# Patient Record
Sex: Female | Born: 1990 | Race: White | Hispanic: No | Marital: Single | Smoking: Current every day smoker
Health system: Southern US, Community
[De-identification: ages and names within clinical notes are randomized; demographics above are authoritative.]

## PROBLEM LIST (undated history)

## (undated) ENCOUNTER — Inpatient Hospital Stay (HOSPITAL_COMMUNITY): Payer: Self-pay

## (undated) DIAGNOSIS — R112 Nausea with vomiting, unspecified: Secondary | ICD-10-CM

## (undated) DIAGNOSIS — Z9889 Other specified postprocedural states: Secondary | ICD-10-CM

## (undated) DIAGNOSIS — E063 Autoimmune thyroiditis: Secondary | ICD-10-CM

## (undated) DIAGNOSIS — A4902 Methicillin resistant Staphylococcus aureus infection, unspecified site: Secondary | ICD-10-CM

## (undated) HISTORY — DX: Methicillin resistant Staphylococcus aureus infection, unspecified site: A49.02

## (undated) HISTORY — DX: Other specified postprocedural states: R11.2

## (undated) HISTORY — DX: Autoimmune thyroiditis: E06.3

## (undated) HISTORY — PX: LEG WOUND REPAIR / CLOSURE: SUR1143

## (undated) HISTORY — DX: Other specified postprocedural states: Z98.890

## (undated) HISTORY — PX: WISDOM TOOTH EXTRACTION: SHX21

---
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
## 2015-04-14 ENCOUNTER — Encounter: Payer: Self-pay | Admitting: *Deleted

## 2015-04-20 ENCOUNTER — Inpatient Hospital Stay (HOSPITAL_COMMUNITY): Payer: BLUE CROSS/BLUE SHIELD

## 2015-04-20 ENCOUNTER — Ambulatory Visit (INDEPENDENT_AMBULATORY_CARE_PROVIDER_SITE_OTHER): Payer: Medicaid Other | Admitting: Student

## 2015-04-20 ENCOUNTER — Inpatient Hospital Stay (HOSPITAL_COMMUNITY)
Admission: AD | Admit: 2015-04-20 | Discharge: 2015-04-20 | Disposition: A | Discharge status: Home / Self Care | Payer: BLUE CROSS/BLUE SHIELD | Source: Ambulatory Visit | Attending: Family Medicine | Admitting: Family Medicine

## 2015-04-20 ENCOUNTER — Encounter: Payer: Self-pay | Admitting: Student

## 2015-04-20 ENCOUNTER — Encounter (HOSPITAL_COMMUNITY): Payer: Self-pay | Admitting: *Deleted

## 2015-04-20 ENCOUNTER — Encounter (HOSPITAL_COMMUNITY): Payer: Self-pay | Admitting: Anesthesiology

## 2015-04-20 VITALS — BP 107/66 | HR 75 | Temp 98.4°F | Ht 61.0 in | Wt 91.2 lb

## 2015-04-20 DIAGNOSIS — Z3481 Encounter for supervision of other normal pregnancy, first trimester: Secondary | ICD-10-CM | POA: Diagnosis not present

## 2015-04-20 DIAGNOSIS — Z3491 Encounter for supervision of normal pregnancy, unspecified, first trimester: Secondary | ICD-10-CM

## 2015-04-20 DIAGNOSIS — IMO0002 Reserved for concepts with insufficient information to code with codable children: Secondary | ICD-10-CM

## 2015-04-20 DIAGNOSIS — Z3A13 13 weeks gestation of pregnancy: Secondary | ICD-10-CM | POA: Diagnosis not present

## 2015-04-20 DIAGNOSIS — O021 Missed abortion: Secondary | ICD-10-CM

## 2015-04-20 DIAGNOSIS — O358XX0 Maternal care for other (suspected) fetal abnormality and damage, not applicable or unspecified: Secondary | ICD-10-CM

## 2015-04-20 LAB — ABO/RH: ABO/RH(D): A POS

## 2015-04-20 LAB — POCT URINALYSIS DIP (DEVICE)
BILIRUBIN URINE: NEGATIVE
GLUCOSE, UA: NEGATIVE mg/dL
KETONES UR: NEGATIVE mg/dL
Leukocytes, UA: NEGATIVE
Nitrite: NEGATIVE
PROTEIN: NEGATIVE mg/dL
SPECIFIC GRAVITY, URINE: 1.025 (ref 1.005–1.030)
Urobilinogen, UA: 0.2 mg/dL (ref 0.0–1.0)
pH: 7 (ref 5.0–8.0)

## 2015-04-20 LAB — TYPE AND SCREEN
ABO/RH(D): A POS
Antibody Screen: NEGATIVE

## 2015-04-20 LAB — CBC
HEMATOCRIT: 38.5 % (ref 36.0–46.0)
HEMOGLOBIN: 13.4 g/dL (ref 12.0–15.0)
MCH: 31.8 pg (ref 26.0–34.0)
MCHC: 34.8 g/dL (ref 30.0–36.0)
MCV: 91.4 fL (ref 78.0–100.0)
PLATELETS: 265 10*3/uL (ref 150–400)
RBC: 4.21 MIL/uL (ref 3.87–5.11)
RDW: 12.8 % (ref 11.5–15.5)
WBC: 9.2 10*3/uL (ref 4.0–10.5)

## 2015-04-20 MED ORDER — BUTALBITAL-APAP-CAFFEINE 50-325-40 MG PO TABS: 1.0000 | ORAL_TABLET | Freq: Four times a day (QID) | ORAL | Status: DC | PRN

## 2015-04-20 MED ORDER — DOXYCYCLINE HYCLATE 100 MG IV SOLR
100.0000 mg | INTRAVENOUS | Status: AC
  Administered 2015-04-21: 100 mg via INTRAVENOUS
  Filled 2015-04-20: qty 100

## 2015-04-20 MED ORDER — BUTALBITAL-APAP-CAFFEINE 50-325-40 MG PO TABS
2.0000 | ORAL_TABLET | Freq: Once | ORAL | Status: AC
  Administered 2015-04-20: 2 via ORAL
  Filled 2015-04-20: qty 2

## 2015-04-20 NOTE — MAU Provider Note (Signed)
Chief Complaint: IUFD    First Provider Initiated Contact with Patient 04/20/15 1440     SUBJECTIVE HPI: Katie GeraldsHaley N Melton is a 24 y.o. G3P1011 at 2238w4d who sent to [facility] Admissions from clinic for absence of FHTs by doppler and informal BS US. [facility] [place]. Pt denies VB or abd pain. Needs formal US to confirm demise, measures CRL and determine if pt is candidate for cytotec or will need D&C.   Past Medical History  Diagnosis Date  . PONV (postoperative nausea and vomiting)   . Hashimoto's disease   . MRSA (methicillin resistant Staphylococcus aureus)    OB History  Gravida Para Term Preterm AB SAB TAB Ectopic Multiple Living  3 1 1  0 1 1 0 0 0 1    # Outcome Date GA Lbr Len/2nd Weight Sex Delivery Anes PTL Lv  3 Current           2 SAB 01/2015          1 Term 12/21/11 4338w0d  6 lb 13 oz (3.09 kg) F Vag-Spont  Y Y     Complications: Tachycardia     Past Surgical History  Procedure Laterality Date  . Leg wound repair / closure      has MRSA on thigh, had to be cut out  . Wisdom tooth extraction     Social History   Social History  . Marital Status: Single    Spouse Name: N/A  . Number of Children: N/A  . Years of Education: N/A   Occupational History  . Not on file.   Social History Main Topics  . Smoking status: Former Games developermoker  . Smokeless tobacco: Never Used  . Alcohol Use: No  . Drug Use: No  . Sexual Activity: Yes   Other Topics Concern  . Not on file   Social History Narrative   No current facility-administered medications on file prior to encounter.   No current outpatient prescriptions on file prior to encounter.   Allergies  Allergen Reactions  . Flagyl [Metronidazole] Hives  . Latex Itching  . Sulfa Antibiotics Hives    I have reviewed the past Medical Hx, Surgical Hx, Social Hx, Allergies and Medications.   Review of Systems  Constitutional: Negative for fever and chills.   Gastrointestinal: Negative for abdominal pain.  Genitourinary: Negative for vaginal bleeding.    OBJECTIVE Patient Vitals for the past 24 hrs:  BP Temp Temp src Pulse Resp  04/20/15 1423 115/81 mmHg 98.2 F (36.8 C) Oral 88 18   Constitutional: Well-developed, well-nourished female in no acute distress. Tearful. Cardiovascular: normal rate Respiratory: normal rate and effort.  GI: Abd soft, non-tender, gravid, S<D Neurologic: Alert and oriented x 4.  GU: Deferred  LAB RESULTS Results for orders placed or performed during the hospital encounter of 04/20/15 (from the past 24 hour(s))  CBC     Status: None   Collection Time: 04/20/15  2:58 PM  Result Value Ref Range   WBC 9.2 4.0 - 10.5 K/uL   RBC 4.21 3.87 - 5.11 MIL/uL   Hemoglobin 13.4 12.0 - 15.0 g/dL   HCT 21.338.5 08.636.0 - 57.846.0 %   MCV 91.4 78.0 - 100.0 fL   MCH 31.8 26.0 - 34.0 pg   MCHC 34.8 30.0 - 36.0 g/dL   RDW 46.912.8 62.911.5 - 52.815.5 %   Platelets 265 150 - 400 K/uL  Type and screen  Status: None   Collection Time: 04/20/15  2:58 PM  Result Value Ref Range   ABO/RH(D) A POS    Antibody Screen NEG    Sample Expiration 04/23/2015   ABO/Rh     Status: None   Collection Time: 04/20/15  2:58 PM  Result Value Ref Range   ABO/RH(D) A POS     IMAGING US Ob Comp Less 14 Wks  04/20/2015  CLINICAL DATA:  No fetal heart tones EXAM: OBSTETRIC <14 WK ULTRASOUND TECHNIQUE: Transabdominal ultrasound was performed for evaluation of the gestation as well as the maternal uterus and adnexal regions. COMPARISON:  None. FINDINGS: Intrauterine gestational sac: Visualized/normal in shape. Yolk sac:  Not visualized Embryo:  Visualized Cardiac Activity: Not visualized Heart Rate:  bpm MSD:   mm    w     d CRL:   40.1  mm   10 w 6 d                  [place] EDC: 11/10/2015 Maternal uterus/adnexae: No subchorionic hemorrhage. No adnexal masses or free fluid. IMPRESSION: No detectable fetal heart tones on today's study. Estimated gestational age [redacted] weeks  6 days. Findings meet definitive criteria for failed pregnancy. This follows SRU consensus guidelines: Diagnostic Criteria for Nonviable Pregnancy Early in the First Trimester. Macy Mis J Med [phone]. Electronically Signed   By: Charlett Nose M.D.   On: 04/20/2015 15:52    MAU COURSE [place], CBC, T&S, NPO.   [place] confirms demise. W/ CRL 10.6 pt is not candidate for Cytotec. Discussed Hx, exam, [place] w/ Dr. Shawnie Pons who came to see pt. D&C scheduled for 1300 tomorrow. Light bleeding started after [place].   Patient reports headache consistent with chronic headaches. Requesting pain medication. No Fever, chills, vision changes, difficulties with speech or gait. Fioricet given.  Headache improved significantly.  MDM Fetal demise with fetus measuring 10.6 weeks. Not candidate for Cytotec. Scheduled for [facility] tomorrow. Patient hemodynamically stable.  ASSESSMENT 1. Missed abortion   2. Absence of fetal heart activity     PLAN Discharge home in stable condition. SAB Precautions Support given. Offered chaplain, declined. Nothing by mouth after midnight.     Follow-up Information    [facility] with [facility] On 04/21/2015.   Why:  arrive at 12:00 noon for D&C at 1:00 pm   Contact information:   [address] [id number] mc [place] [address] [phone]      Follow up with [facility].   Why:  As needed in emergencies (soaking a pad an hour for more than 1 hour, fever, severe abd pain)   Contact information:   [address] [id number] mc [place] [address] [phone]       Medication List    TAKE these medications        butalbital-acetaminophen-caffeine 50-325-40 MG tablet  Commonly known as:  FIORICET, ESGIC  Take 1-2 tablets by mouth every 6 (six) hours as needed for headache.        [place], CNM 04/20/2015  4:58 PM

## 2015-04-20 NOTE — Progress Notes (Signed)
Patient presented for initial OB visit. Denies abdominal pain or vaginal bleeding.  EDD based on 7516w6d ultrasound in [place] (see media tab).  Unable to doppler fetal heart tones today.  Per bedside ultrasound by Wynelle BourgeoisMarie Williams CNM, no cardiac activity.  S/w Dr. Shawnie PonsPratt about patient; will send to MAU for official ultrasound & treatment options.   Judeth HornErin Derrious Bologna, NP

## 2015-04-20 NOTE — MAU Note (Signed)
Sent from clinic for U/S and tx options due to IUFD.

## 2015-04-20 NOTE — Progress Notes (Signed)
Reports chest pain the other night; also reports lower back pain & lower stomach cramps

## 2015-04-20 NOTE — Progress Notes (Signed)
Work notes given to pt and sig other per request of V. Katrinka BlazingSmith, CNM.

## 2015-04-20 NOTE — Progress Notes (Signed)
Dr. Shawnie PonsPratt discussed results and plan of care. [facility]

## 2015-04-20 NOTE — Discharge Instructions (Signed)
Do not eat or drink anything after midnight tonight. Arrive at admissions desk at noon tomorrow 04/20/15)  Incomplete Miscarriage A miscarriage is the sudden loss of an unborn baby (fetus) before the 20th week of pregnancy. In an incomplete miscarriage, parts of the fetus or placenta (afterbirth) remain in the body.  Having a miscarriage can be an emotional experience. Talk with your health care provider about any questions you may have about miscarrying, the grieving process, and your future pregnancy plans. CAUSES   Problems with the fetal chromosomes that make it impossible for the baby to develop normally. Problems with the baby's genes or chromosomes are most often the result of errors that occur by chance as the embryo divides and grows. The problems are not inherited from the parents.  Infection of the cervix or uterus.  Hormone problems.  Problems with the cervix, such as having an incompetent cervix. This is when the tissue in the cervix is not strong enough to hold the pregnancy.  Problems with the uterus, such as an abnormally shaped uterus, uterine fibroids, or congenital abnormalities.  Certain medical conditions.  Smoking, drinking alcohol, or taking illegal drugs.  Trauma. SYMPTOMS   Vaginal bleeding or spotting, with or without cramps or pain.  Pain or cramping in the abdomen or lower back.  Passing fluid, tissue, or blood clots from the vagina. DIAGNOSIS  Your health care provider will perform a physical exam. You may also have an ultrasound to confirm the miscarriage. Blood or urine tests may also be ordered. TREATMENT   Usually, a dilation and curettage (D&C) procedure is performed. During a D&C procedure, the cervix is widened (dilated) and any remaining fetal or placental tissue is gently removed from the uterus.  Antibiotic medicines are prescribed if there is an infection. Other medicines may be given to reduce the size of the uterus (contract) if there is a  lot of bleeding.  If you have Rh negative blood and your baby was Rh positive, you will need a Rho (D) immune globulin shot. This shot will protect any future baby from having Rh blood problems in future pregnancies.  You may be confined to bed rest. This means you should stay in bed and only get up to use the bathroom. HOME CARE INSTRUCTIONS   Rest as directed by your health care provider.  Restrict activity as directed by your health care provider. You may be allowed to continue light activity if curettage was not done but you require further treatment.  Keep track of the number of pads you use each day. Keep track of how soaked (saturated) they are. Record this information.  Do not  use tampons.  Do not douche or have sexual intercourse until approved by your health care provider.  Keep all follow-up appointments for reevaluation and continuing management.  Only take over-the-counter or prescription medicines for pain, fever, or discomfort as directed by your health care provider.  Take antibiotic medicine as directed by your health care provider. Make sure you finish it even if you start to feel better. SEEK IMMEDIATE MEDICAL CARE IF:   You experience severe cramps in your stomach, back, or abdomen.  You have an unexplained temperature (make sure to record these temperatures).  You pass large clots or tissue (save these for your health care provider to inspect).  Your bleeding increases.  You become light-headed, weak, or have fainting episodes. MAKE SURE YOU:   Understand these instructions.  Will watch your condition.  Will get help right  away if you are not doing well or get worse.   This information is not intended to replace advice given to you by your health care provider. Make sure you discuss any questions you have with your health care provider.   Document Released: 05/01/2005 Document Revised: 05/22/2014 Document Reviewed: 11/28/2012 Elsevier Interactive  Patient Education [employer]2016 Elsevier Inc.

## 2015-04-21 ENCOUNTER — Ambulatory Visit (HOSPITAL_COMMUNITY): Payer: BLUE CROSS/BLUE SHIELD | Admitting: Anesthesiology

## 2015-04-21 ENCOUNTER — Ambulatory Visit (HOSPITAL_COMMUNITY)
Admission: RE | Admit: 2015-04-21 | Discharge: 2015-04-21 | Disposition: A | Discharge status: Home / Self Care | Payer: BLUE CROSS/BLUE SHIELD | Source: Ambulatory Visit | Attending: Obstetrics and Gynecology | Admitting: Obstetrics and Gynecology

## 2015-04-21 ENCOUNTER — Encounter (HOSPITAL_COMMUNITY)
Admission: RE | Disposition: A | Discharge status: Home / Self Care | Payer: Self-pay | Source: Ambulatory Visit | Attending: Obstetrics and Gynecology

## 2015-04-21 DIAGNOSIS — O021 Missed abortion: Secondary | ICD-10-CM

## 2015-04-21 DIAGNOSIS — Z87891 Personal history of nicotine dependence: Secondary | ICD-10-CM | POA: Diagnosis not present

## 2015-04-21 DIAGNOSIS — Z3A10 10 weeks gestation of pregnancy: Secondary | ICD-10-CM | POA: Insufficient documentation

## 2015-04-21 HISTORY — PX: DILATION AND EVACUATION: SHX1459

## 2015-04-21 SURGERY — DILATION AND EVACUATION, UTERUS
Anesthesia: Monitor Anesthesia Care | Site: Vagina

## 2015-04-21 MED ORDER — KETOROLAC TROMETHAMINE 30 MG/ML IJ SOLN
INTRAMUSCULAR | Status: AC
  Filled 2015-04-21: qty 1

## 2015-04-21 MED ORDER — LACTATED RINGERS IV SOLN: INTRAVENOUS | Status: DC

## 2015-04-21 MED ORDER — ONDANSETRON HCL 4 MG/2ML IJ SOLN
INTRAMUSCULAR | Status: AC
Start: 2015-04-21 — End: 2015-04-21
  Filled 2015-04-21: qty 2

## 2015-04-21 MED ORDER — MEPERIDINE HCL 25 MG/ML IJ SOLN: 6.2500 mg | INTRAMUSCULAR | Status: DC | PRN

## 2015-04-21 MED ORDER — MIDAZOLAM HCL 2 MG/2ML IJ SOLN
INTRAMUSCULAR | Status: DC | PRN
  Administered 2015-04-21: 2 mg via INTRAVENOUS

## 2015-04-21 MED ORDER — OXYCODONE-ACETAMINOPHEN 5-325 MG PO TABS: 1.0000 | ORAL_TABLET | ORAL | Status: DC | PRN

## 2015-04-21 MED ORDER — LACTATED RINGERS IV SOLN
INTRAVENOUS | Status: DC
  Administered 2015-04-21 (×4): via INTRAVENOUS

## 2015-04-21 MED ORDER — OXYCODONE-ACETAMINOPHEN 5-325 MG PO TABS
ORAL_TABLET | ORAL | Status: AC
  Filled 2015-04-21: qty 1

## 2015-04-21 MED ORDER — SCOPOLAMINE 1 MG/3DAYS TD PT72
MEDICATED_PATCH | TRANSDERMAL | Status: AC
  Administered 2015-04-21: 1.5 mg via TRANSDERMAL
  Filled 2015-04-21: qty 1

## 2015-04-21 MED ORDER — SCOPOLAMINE 1 MG/3DAYS TD PT72
1.0000 | MEDICATED_PATCH | Freq: Once | TRANSDERMAL | Status: DC
  Administered 2015-04-21: 1.5 mg via TRANSDERMAL

## 2015-04-21 MED ORDER — OXYCODONE-ACETAMINOPHEN 5-325 MG PO TABS
1.0000 | ORAL_TABLET | ORAL | Status: DC | PRN
  Administered 2015-04-21: 1 via ORAL

## 2015-04-21 MED ORDER — DEXAMETHASONE SODIUM PHOSPHATE 10 MG/ML IJ SOLN
INTRAMUSCULAR | Status: AC
Start: 2015-04-21 — End: 2015-04-21
  Filled 2015-04-21: qty 1

## 2015-04-21 MED ORDER — KETOROLAC TROMETHAMINE 30 MG/ML IJ SOLN
INTRAMUSCULAR | Status: DC | PRN
  Administered 2015-04-21: 30 mg via INTRAVENOUS

## 2015-04-21 MED ORDER — PROPOFOL 10 MG/ML IV BOLUS
INTRAVENOUS | Status: DC | PRN
  Administered 2015-04-21 (×2): 20 mg via INTRAVENOUS
  Administered 2015-04-21: 30 mg via INTRAVENOUS
  Administered 2015-04-21: 20 mg via INTRAVENOUS

## 2015-04-21 MED ORDER — FENTANYL CITRATE (PF) 100 MCG/2ML IJ SOLN: 25.0000 ug | INTRAMUSCULAR | Status: DC | PRN

## 2015-04-21 MED ORDER — PROPOFOL 10 MG/ML IV BOLUS
INTRAVENOUS | Status: AC
Start: 2015-04-21 — End: 2015-04-21
  Filled 2015-04-21: qty 40

## 2015-04-21 MED ORDER — ONDANSETRON HCL 4 MG/2ML IJ SOLN
INTRAMUSCULAR | Status: DC | PRN
  Administered 2015-04-21: 4 mg via INTRAVENOUS

## 2015-04-21 MED ORDER — DEXAMETHASONE SODIUM PHOSPHATE 4 MG/ML IJ SOLN
INTRAMUSCULAR | Status: DC | PRN
  Administered 2015-04-21: 4 mg via INTRAVENOUS

## 2015-04-21 MED ORDER — LIDOCAINE HCL (CARDIAC) 20 MG/ML IV SOLN
INTRAVENOUS | Status: DC | PRN
  Administered 2015-04-21: 80 mg via INTRAVENOUS

## 2015-04-21 MED ORDER — PROMETHAZINE HCL 25 MG/ML IJ SOLN: 6.2500 mg | INTRAMUSCULAR | Status: DC | PRN

## 2015-04-21 MED ORDER — CHLOROPROCAINE HCL 1 % IJ SOLN
INTRAMUSCULAR | Status: AC
  Filled 2015-04-21: qty 30

## 2015-04-21 MED ORDER — IBUPROFEN 600 MG PO TABS: 600.0000 mg | ORAL_TABLET | Freq: Four times a day (QID) | ORAL | Status: DC | PRN

## 2015-04-21 MED ORDER — MIDAZOLAM HCL 2 MG/2ML IJ SOLN
INTRAMUSCULAR | Status: AC
  Filled 2015-04-21: qty 2

## 2015-04-21 MED ORDER — LIDOCAINE HCL (CARDIAC) 20 MG/ML IV SOLN
INTRAVENOUS | Status: AC
  Filled 2015-04-21: qty 5

## 2015-04-21 MED ORDER — FENTANYL CITRATE (PF) 100 MCG/2ML IJ SOLN
INTRAMUSCULAR | Status: AC
  Filled 2015-04-21: qty 2

## 2015-04-21 MED ORDER — FENTANYL CITRATE (PF) 100 MCG/2ML IJ SOLN
INTRAMUSCULAR | Status: AC
  Filled 2015-04-21: qty 4

## 2015-04-21 MED ORDER — CHLOROPROCAINE HCL 1 % IJ SOLN
INTRAMUSCULAR | Status: DC | PRN
  Administered 2015-04-21: 10 mL

## 2015-04-21 MED ORDER — METOCLOPRAMIDE HCL 5 MG/ML IJ SOLN
INTRAMUSCULAR | Status: DC | PRN
  Administered 2015-04-21: 10 mg via INTRAVENOUS

## 2015-04-21 MED ORDER — FENTANYL CITRATE (PF) 100 MCG/2ML IJ SOLN
INTRAMUSCULAR | Status: DC | PRN
  Administered 2015-04-21: 25 ug via INTRAVENOUS
  Administered 2015-04-21 (×2): 50 ug via INTRAVENOUS

## 2015-04-21 MED ORDER — DIPHENHYDRAMINE HCL 50 MG/ML IJ SOLN
INTRAMUSCULAR | Status: DC | PRN
  Administered 2015-04-21: 12.5 mg via INTRAVENOUS

## 2015-04-21 SURGICAL SUPPLY — 20 items
CATH ROBINSON RED A/P 16FR (CATHETERS) ×3 IMPLANT
DECANTER SPIKE VIAL GLASS SM (MISCELLANEOUS) ×3 IMPLANT
GLOVE BIOGEL PI IND STRL 6.5 (GLOVE) ×1 IMPLANT
GLOVE BIOGEL PI IND STRL 7.0 (GLOVE) ×1 IMPLANT
GLOVE BIOGEL PI INDICATOR 6.5 (GLOVE) ×2
GLOVE BIOGEL PI INDICATOR 7.0 (GLOVE) ×2
GLOVE SURG SS PI 6.0 STRL IVOR (GLOVE) ×3 IMPLANT
GOWN STRL REUS W/TWL LRG LVL3 (GOWN DISPOSABLE) ×6 IMPLANT
KIT BERKELEY 1ST TRIMESTER 3/8 (MISCELLANEOUS) ×3 IMPLANT
NS IRRIG 1000ML POUR BTL (IV SOLUTION) ×3 IMPLANT
PACK VAGINAL MINOR WOMEN LF (CUSTOM PROCEDURE TRAY) ×3 IMPLANT
PAD OB MATERNITY 4.3X12.25 (PERSONAL CARE ITEMS) ×3 IMPLANT
PAD PREP 24X48 CUFFED NSTRL (MISCELLANEOUS) ×3 IMPLANT
SET BERKELEY SUCTION TUBING (SUCTIONS) ×3 IMPLANT
TOWEL OR 17X24 6PK STRL BLUE (TOWEL DISPOSABLE) ×6 IMPLANT
VACURETTE 10 RIGID CVD (CANNULA) IMPLANT
VACURETTE 12 RIGID CVD (CANNULA) ×2 IMPLANT
VACURETTE 7MM CVD STRL WRAP (CANNULA) IMPLANT
VACURETTE 8 RIGID CVD (CANNULA) IMPLANT
VACURETTE 9 RIGID CVD (CANNULA) IMPLANT

## 2015-04-21 NOTE — Transfer of Care (Signed)
Immediate Anesthesia Transfer of Care Note  Patient: Katie Melton  Procedure(s) Performed: Procedure(s): DILATATION AND EVACUATION (N/A)  Patient Location: PACU  Anesthesia Type:MAC  Level of Consciousness: sedated  Airway & Oxygen Therapy: Patient Spontanous Breathing  Post-op Assessment: Report given to RN  Post vital signs: Reviewed and stable  Last Vitals:  Filed Vitals:   04/21/15 1219  BP: 105/70  Pulse: 66  Temp: 36.7 C    Complications: No apparent anesthesia complications

## 2015-04-21 NOTE — Anesthesia Preprocedure Evaluation (Addendum)
Anesthesia Evaluation  Patient identified by MRN, date of birth, ID band Patient awake    History of Anesthesia Complications (+) PONV and history of anesthetic complications  Airway Mallampati: I  TM Distance: >3 FB Neck ROM: Full    Dental  (+) Teeth Intact   Pulmonary former smoker,    breath sounds clear to auscultation       Cardiovascular negative cardio ROS   Rhythm:Regular Rate:Normal     Neuro/Psych negative psych ROS   GI/Hepatic negative GI ROS, Neg liver ROS,   Endo/Other  negative endocrine ROS  Renal/GU negative Renal ROS  negative genitourinary   Musculoskeletal negative musculoskeletal ROS (+)   Abdominal   Peds negative pediatric ROS (+)  Hematology negative hematology ROS (+)   Anesthesia Other Findings   Reproductive/Obstetrics                            Anesthesia Physical Anesthesia Plan  ASA: II  Anesthesia Plan: MAC   Post-op Pain Management:    Induction: Inhalational  Airway Management Planned: Natural Airway and Nasal Cannula  Additional Equipment:   Intra-op Plan:   Post-operative Plan: Extubation in OR  Informed Consent: I have reviewed the patients History and Physical, chart, labs and discussed the procedure including the risks, benefits and alternatives for the proposed anesthesia with the patient or authorized representative who has indicated his/her understanding and acceptance.   Dental advisory given  Plan Discussed with: CRNA  Anesthesia Plan Comments:         Anesthesia Quick Evaluation

## 2015-04-21 NOTE — Anesthesia Postprocedure Evaluation (Signed)
Anesthesia Post Note  Patient: Katie Melton  Procedure(s) Performed: Procedure(s) (LRB): DILATATION AND EVACUATION (N/A)  Patient location during evaluation: PACU Anesthesia Type: General Level of consciousness: sedated Pain management: pain level controlled Vital Signs Assessment: post-procedure vital signs reviewed and stable Respiratory status: spontaneous breathing and respiratory function stable Cardiovascular status: stable Anesthetic complications: no    Last Vitals:  Filed Vitals:   04/21/15 1500 04/21/15 1515  BP: 94/60 93/63  Pulse: 61 62  Temp:    Resp: 12 12    Last Pain: There were no vitals filed for this visit.               Cheral Cappucci DANIEL

## 2015-04-21 NOTE — H&P (Signed)
Katie Melton is an 24 y.o. female G3P1011 at 13 weeks by LMP here for D&E. Patient was diagnosed with a missed Ab on 04/20/2015 and is here for scheduled dilatation and evacuation. Patient is currently without complaints and denies abdominal pain or vaginal bleeding  Pertinent Gynecological History: Menses: regular every month without intermenstrual spotting Contraception: none DES exposure: denies Blood transfusions: none Sexually transmitted diseases: no past history  OB History: G3, P1011   Menstrual History: No LMP recorded. Patient is pregnant.    Past Medical History  Diagnosis Date  . PONV (postoperative nausea and vomiting)   . Hashimoto's disease   . MRSA (methicillin resistant Staphylococcus aureus)     Past Surgical History  Procedure Laterality Date  . Leg wound repair / closure      has MRSA on thigh, had to be cut out  . Wisdom tooth extraction      Family History  Problem Relation Age of Onset  . Heart disease Mother   . Cancer Maternal Aunt   . Cancer Maternal Uncle   . ADD / ADHD Sister   . Autism spectrum disorder Brother     Social History:  reports that she has quit smoking. She has never used smokeless tobacco. She reports that she does not drink alcohol or use illicit drugs.  Allergies:  Allergies  Allergen Reactions  . Flagyl [Metronidazole] Hives  . Latex Itching  . Sulfa Antibiotics Hives    Prescriptions prior to admission  Medication Sig Dispense Refill Last Dose  . butalbital-acetaminophen-caffeine (FIORICET, ESGIC) 50-325-40 MG tablet Take 1-2 tablets by mouth every 6 (six) hours as needed for headache. 10 tablet 0   . promethazine (PHENERGAN) 12.5 MG tablet Take 12.5 mg by mouth every 6 (six) hours as needed for nausea or vomiting.   04/20/2015 at Unknown time    ROS  Blood pressure 105/70, pulse 66, temperature 98.1 F (36.7 C), temperature source Oral, SpO2 100 %. Physical Exam GENERAL: Well-developed, well-nourished female in  no acute distress.  HEENT: Normocephalic, atraumatic. Sclerae anicteric.  NECK: Supple. Normal thyroid.  LUNGS: Clear to auscultation bilaterally.  HEART: Regular rate and rhythm. ABDOMEN: Soft, nontender, nondistended. No organomegaly. PELVIC: deferred to OR EXTREMITIES: No cyanosis, clubbing, or edema, 2+ distal pulses.  Results for orders placed or performed during the hospital encounter of 04/20/15 (from the past 24 hour(s))  CBC     Status: None   Collection Time: 04/20/15  2:58 PM  Result Value Ref Range   WBC 9.2 4.0 - 10.5 K/uL   RBC 4.21 3.87 - 5.11 MIL/uL   Hemoglobin 13.4 12.0 - 15.0 g/dL   HCT 40.9 81.1 - 91.4 %   MCV 91.4 78.0 - 100.0 fL   MCH 31.8 26.0 - 34.0 pg   MCHC 34.8 30.0 - 36.0 g/dL   RDW 78.2 95.6 - 21.3 %   Platelets 265 150 - 400 K/uL  Type and screen     Status: None   Collection Time: 04/20/15  2:58 PM  Result Value Ref Range   ABO/RH(D) A POS    Antibody Screen NEG    Sample Expiration 04/23/2015   ABO/Rh     Status: None   Collection Time: 04/20/15  2:58 PM  Result Value Ref Range   ABO/RH(D) A POS     US Ob Comp Less 14 Wks  04/20/2015  CLINICAL DATA:  No fetal heart tones EXAM: OBSTETRIC <14 WK ULTRASOUND TECHNIQUE: Transabdominal ultrasound was performed for evaluation of  the gestation as well as the maternal uterus and adnexal regions. COMPARISON:  None. FINDINGS: Intrauterine gestational sac: Visualized/normal in shape. Yolk sac:  Not visualized Embryo:  Visualized Cardiac Activity: Not visualized Heart Rate:  bpm MSD:   mm    w     d CRL:   40.1  mm   10 w 6 d                  US EDC: 11/10/2015 Maternal uterus/adnexae: No subchorionic hemorrhage. No adnexal masses or free fluid. IMPRESSION: No detectable fetal heart tones on today's study. Estimated gestational age [redacted] weeks 6 days. Findings meet definitive criteria for failed pregnancy. This follows SRU consensus guidelines: Diagnostic Criteria for Nonviable Pregnancy Early in the First  Trimester. Macy Mis Engl J Med [id number]:1443-51. Electronically Signed   By: Charlett NoseKevin  Dover M.D.   On: 04/20/2015 15:52    Assessment/Plan: 24 yo G3P1011 with a 5431w6d missed abortion here for dilatation and evacuation - Risks, benefits and alternatives were explained including but not limited to risks of bleeding, infection, uterine perforation and damage to adjacent organs. Patient verbalized understanding and all questions were answered.  Katie Melton 04/21/2015, 12:59 PM

## 2015-04-21 NOTE — Op Note (Signed)
Mcarthur RossettiHaley N Georgiou PROCEDURE DATE: 04/21/2015  PREOPERATIVE DIAGNOSIS: 10.6 week missed abortion. POSTOPERATIVE DIAGNOSIS: The same. PROCEDURE:     Dilation and Evacuation. SURGEON:  Dr. Catalina AntiguaPeggy Crystalmarie Yasin  INDICATIONS: 24 y.o. G3P1011with MAB at 10.[redacted] weeks gestation, needing surgical completion.  Risks of surgery were discussed with the patient including but not limited to: bleeding which may require transfusion; infection which may require antibiotics; injury to uterus or surrounding organs;need for additional procedures including laparotomy or laparoscopy; possibility of intrauterine scarring which may impair future fertility; and other postoperative/anesthesia complications. Written informed consent was obtained.    FINDINGS:  A 12week size anteverted uterus, moderate amounts of products of conception, specimen sent to pathology.  ANESTHESIA:    Monitored intravenous sedation, paracervical block. INTRAVENOUS FLUIDS:  1700 ml of LR ESTIMATED BLOOD LOSS:  Less than 20 ml. SPECIMENS:  Products of conception sent to pathology COMPLICATIONS:  None immediate.  PROCEDURE DETAILS:  The patient received intravenous antibiotics while in the preoperative area.  She was then taken to the operating room where general anesthesia was administered and was found to be adequate.  After an adequate timeout was performed, she was placed in the dorsal lithotomy position and examined; then prepped and draped in the sterile manner.   Her bladder was catheterized for an unmeasured amount of clear, yellow urine. A vaginal speculum was then placed in the patient's vagina and a single tooth tenaculum was applied to the anterior lip of the cervix.  A paracervical block using 0.5% Marcaine was administered. The cervix was gently dilated to accommodate a 12 mm suction curette that was gently advanced to the uterine fundus.  The suction device was then activated and curette slowly rotated to clear the uterus of products of conception.   A sharp curettage was then performed to confirm complete emptying of the uterus. There was minimal bleeding noted and the tenaculum removed with good hemostasis noted.   All instruments were removed from the patient's vagina. The patient tolerated the procedure well and was taken to the recovery area awake, and in stable condition.  The patient will be discharged to home as per PACU criteria.  Routine postoperative instructions given.  She was prescribed Percocet and Ibuprofen.  She will follow up in the clinic in 2 weeks for postoperative evaluation.

## 2015-04-21 NOTE — Discharge Instructions (Signed)
Dilation and Curettage or Vacuum Curettage, Care After °Refer to this sheet in the next few weeks. These instructions provide you with information on caring for yourself after your procedure. Your health care provider may also give you more specific instructions. Your treatment has been planned according to current medical practices, but problems sometimes occur. Call your health care provider if you have any problems or questions after your procedure. °WHAT TO EXPECT AFTER THE PROCEDURE °After your procedure, it is typical to have light cramping and bleeding. This may last for 2 days to 2 weeks after the procedure. °HOME CARE INSTRUCTIONS  °· Do not drive for 24 hours. °· Wait 1 week before returning to strenuous activities. °· Take your temperature 2 times a day for 4 days and write it down. Provide these temperatures to your health care provider if you develop a fever. °· Avoid long periods of standing. °· Avoid heavy lifting, pushing, or pulling. Do not lift anything heavier than 10 pounds (4.5 kg). °· Limit stair climbing to once or twice a day. °· Take rest periods often. °· You may resume your usual diet. °· Drink enough fluids to keep your urine clear or pale yellow. °· Your usual bowel function should return. If you have constipation, you may: °¨ Take a mild laxative with permission from your health care provider. °¨ Add fruit and bran to your diet. °¨ Drink more fluids. °· Take showers instead of baths until your health care provider gives you permission to take baths. °· Do not go swimming or use a hot tub until your health care provider approves. °· Try to have someone with you or available to you the first 24-48 hours, especially if you were given a general anesthetic. °· Do not douche, use tampons, or have sex (intercourse) for 2 weeks after the procedure. °· Only take over-the-counter or prescription medicines as directed by your health care provider. Do not take aspirin. It can cause  bleeding. °· Follow up with your health care provider as directed. °SEEK MEDICAL CARE IF:  °· You have increasing cramps or pain that is not relieved with medicine. °· You have abdominal pain that does not seem to be related to the same area of earlier cramping and pain. °· You have bad smelling vaginal discharge. °· You have a rash. °· You are having problems with any medicine. °SEEK IMMEDIATE MEDICAL CARE IF:  °· You have bleeding that is heavier than a normal menstrual period. °· You have a fever. °· You have chest pain. °· You have shortness of breath. °· You feel dizzy or feel like fainting. °· You pass out. °· You have pain in your shoulder strap area. °· You have heavy vaginal bleeding with or without blood clots. °MAKE SURE YOU:  °· Understand these instructions. °· Will watch your condition. °· Will get help right away if you are not doing well or get worse. °  °This information is not intended to replace advice given to you by your health care provider. Make sure you discuss any questions you have with your health care provider. °  °Document Released: 04/28/2000 Document Revised: 05/06/2013 Document Reviewed: 11/28/2012 °Elsevier Interactive Patient Education ©2016 [employer] ° °Post Anesthesia Home Care Instructions ° °Activity: °Get plenty of rest for the remainder of the day. A responsible adult should stay with you for 24 hours following the procedure.  °For the next 24 hours, DO NOT: °-Drive a car °-Operate machinery °-Drink alcoholic beverages °-Take any medication unless   instructed by your physician °-Make any legal decisions or sign important papers. ° °Meals: °Start with liquid foods such as gelatin or soup. Progress to regular foods as tolerated. Avoid greasy, spicy, heavy foods. If nausea and/or vomiting occur, drink only clear liquids until the nausea and/or vomiting subsides. Call your physician if vomiting continues. ° °Special Instructions/Symptoms: °Your throat may feel dry or sore from  the anesthesia or the breathing tube placed in your throat during surgery. If this causes discomfort, gargle with warm salt water. The discomfort should disappear within 24 hours. ° °If you had a scopolamine patch placed behind your ear for the management of post- operative nausea and/or vomiting: ° °1. The medication in the patch is effective for 72 hours, after which it should be removed.  Wrap patch in a tissue and discard in the trash. Wash hands thoroughly with soap and water. °2. You may remove the patch earlier than 72 hours if you experience unpleasant side effects which may include dry mouth, dizziness or visual disturbances. °3. Avoid touching the patch. Wash your hands with soap and water after contact with the patch. °  ° °

## 2015-04-21 NOTE — Addendum Note (Signed)
Addendum  created 04/21/15 1716 by Algis GreenhouseLinda A Karem Tomaso, CRNA   Modules edited: Clinical Notes, Notes Section   Clinical Notes:  Pend: [id number]   Notes Section:  Delete: [id number]

## 2015-04-22 ENCOUNTER — Encounter (HOSPITAL_COMMUNITY): Payer: Self-pay | Admitting: Obstetrics and Gynecology

## 2015-05-05 ENCOUNTER — Ambulatory Visit (INDEPENDENT_AMBULATORY_CARE_PROVIDER_SITE_OTHER): Payer: BLUE CROSS/BLUE SHIELD | Admitting: Obstetrics and Gynecology

## 2015-05-05 ENCOUNTER — Encounter: Payer: Self-pay | Admitting: Obstetrics and Gynecology

## 2015-05-05 VITALS — BP 112/76 | HR 95 | Temp 98.1°F | Wt 90.5 lb

## 2015-05-05 DIAGNOSIS — Z9889 Other specified postprocedural states: Secondary | ICD-10-CM

## 2015-05-05 DIAGNOSIS — Z30017 Encounter for initial prescription of implantable subdermal contraceptive: Secondary | ICD-10-CM | POA: Diagnosis not present

## 2015-05-05 NOTE — Progress Notes (Signed)
Patient ID: Katie Melton, female   DOB: 12/11/1990, 24 y.o.   MRN: [id number] 24 yo G3P1021 presenting today as a post op check s/p D&E for the treatment of a missed AB. Patient reports doing well physically. She reports some light dark blood. No abdominal pain or pelvic pain. Emotionally she is still having a hard time to cope with the loss. This was not a planned pregnancy but it was desired. This is also the second miscarriage this year with the same partner. She reports having a lot of support and is not interested in medical intervention at this time. She denies suicidal/homicidal ideations. Patient desires to have Nexplanon for contraception.  Past Medical History  Diagnosis Date  . PONV (postoperative nausea and vomiting)   . Hashimoto's disease   . MRSA (methicillin resistant Staphylococcus aureus)    Past Surgical History  Procedure Laterality Date  . Leg wound repair / closure      has MRSA on thigh, had to be cut out  . Wisdom tooth extraction    . Dilation and evacuation N/A 04/21/2015    Procedure: DILATATION AND EVACUATION;  Surgeon: Catalina AntiguaPeggy Audiel Scheiber, MD;  Location: [facility];  Service: Gynecology;  Laterality: N/A;   Family History  Problem Relation Age of Onset  . Heart disease Mother   . Cancer Maternal Aunt   . Cancer Maternal Uncle   . ADD / ADHD Sister   . Autism spectrum disorder Brother    Social History  Substance Use Topics  . Smoking status: Former Games developermoker  . Smokeless tobacco: Never Used  . Alcohol Use: No   ROS See pertinent in HPI  Blood pressure 112/76, pulse 95, temperature 98.1 F (36.7 C), weight 90 lb 8 oz (41.051 kg). GENERAL: Well-developed, well-nourished female in no acute distress.  ABDOMEN: Soft, nontender, nondistended. No organomegaly. PELVIC: Normal external female genitalia. Vagina is pink and rugated.  Normal discharge. Normal appearing cervix. Uterus is normal in size. No adnexal mass or tenderness. EXTREMITIES: No cyanosis, clubbing, or  edema, 2+ distal pulses.  A/P 24 yo s/p Missed ab here for post op check - Patient is medically cleared to resume all activities - Patient desires Nexplanon. She understands that irregular bleeding is a known side effect of this birth control without affecting its efficacy. Patient verbalized understanding. Patient given informed consent, signed copy in the chart, time out was performed. Pregnancy test was negative. Appropriate time out taken.  Patient's left arm was prepped and draped in the usual sterile fashion.. The ruler used to measure and mark insertion area.  Patient was prepped with alcohol swab and then injected with 1 cc of 1% lidocaine with epinephrine.  Patient was prepped with betadine, Nexplanon removed form packaging.  Device confirmed in needle, then inserted full length of needle and withdrawn per handbook instructions.  Patient insertion site covered with a pressure dressing.   Minimal blood loss.  Patient tolerated the procedure well.  - Discussed with the patient the need for genetic testing if another miscarriage occurred with the same partner. - RTC prn

## 2015-05-27 ENCOUNTER — Other Ambulatory Visit (HOSPITAL_COMMUNITY): Payer: Medicaid Other

## 2015-06-10 ENCOUNTER — Telehealth: Payer: Self-pay

## 2015-06-10 NOTE — Telephone Encounter (Signed)
Received call from nurse line patient is requesting something be called in for her irregular bleeding. Patient was currently started on Nexplanon  in December.

## 2015-06-10 NOTE — Telephone Encounter (Signed)
Called [facility] and heard a message this number is either changed, disconnected , or no longer in service.

## 2015-06-15 NOTE — Telephone Encounter (Signed)
Attempted to reach patient number has been disconnected or changed.

## 2015-08-20 ENCOUNTER — Encounter: Payer: Self-pay | Admitting: Obstetrics and Gynecology

## 2015-08-20 ENCOUNTER — Ambulatory Visit (INDEPENDENT_AMBULATORY_CARE_PROVIDER_SITE_OTHER): Payer: BLUE CROSS/BLUE SHIELD | Admitting: Obstetrics and Gynecology

## 2015-08-20 VITALS — BP 111/64 | HR 93 | Temp 98.3°F | Wt 94.5 lb

## 2015-08-20 DIAGNOSIS — N939 Abnormal uterine and vaginal bleeding, unspecified: Secondary | ICD-10-CM

## 2015-08-20 MED ORDER — ETONOGESTREL-ETHINYL ESTRADIOL 0.12-0.015 MG/24HR VA RING: VAGINAL_RING | VAGINAL | Status: DC

## 2015-08-20 NOTE — Progress Notes (Signed)
Patient ID: Katie Melton, female   DOB: 09/08/1990, 25 y.o.   MRN: [id number] 25 yo presenting for the evaluation of abnormal uterine bleeding following Nexplanon placement. Patient has had the Nexplanon since 04/2015. She has experienced daily bleeding since placement. She is aware of this know side effect and would like to treat this bleeding while she keeps it a little longer.  Past Medical History  Diagnosis Date  . PONV (postoperative nausea and vomiting)   . Hashimoto's disease   . MRSA (methicillin resistant Staphylococcus aureus)    Past Surgical History  Procedure Laterality Date  . Leg wound repair / closure      has MRSA on thigh, had to be cut out  . Wisdom tooth extraction    . Dilation and evacuation N/A 04/21/2015    Procedure: DILATATION AND EVACUATION;  Surgeon: Catalina AntiguaPeggy Donna Snooks, MD;  Location: [facility];  Service: Gynecology;  Laterality: N/A;   Family History  Problem Relation Age of Onset  . Heart disease Mother   . Cancer Maternal Aunt   . Cancer Maternal Uncle   . ADD / ADHD Sister   . Autism spectrum disorder Brother    Social History  Substance Use Topics  . Smoking status: Former Games developermoker  . Smokeless tobacco: Never Used  . Alcohol Use: No   ROS See pertinent in HPI  Blood pressure 111/64, pulse 93, temperature 98.3 F (36.8 C), weight 94 lb 8 oz (42.865 kg). GENERAL: Well-developed, well-nourished female in no acute distress.  ABDOMEN: Soft, nontender, nondistended.  PELVIC: Not indicated EXTREMITIES: No cyanosis, clubbing, or edema, 2+ distal pulses.  A/P 25 yo with DUB secondary to Nexplanon - Rx NuvaRing provided - Patient advised to try the NuvaRing for 6 months and to return if persistent vaginal bleeding continues with the Nexplanon - RTC prn

## 2016-02-29 ENCOUNTER — Encounter (HOSPITAL_COMMUNITY): Payer: Self-pay | Admitting: Advanced Practice Midwife

## 2016-02-29 ENCOUNTER — Encounter: Payer: Self-pay | Admitting: Advanced Practice Midwife

## 2016-02-29 ENCOUNTER — Ambulatory Visit (INDEPENDENT_AMBULATORY_CARE_PROVIDER_SITE_OTHER): Payer: BLUE CROSS/BLUE SHIELD | Admitting: Advanced Practice Midwife

## 2016-02-29 VITALS — BP 101/75 | HR 75 | Wt 94.1 lb

## 2016-02-29 DIAGNOSIS — Z3046 Encounter for surveillance of implantable subdermal contraceptive: Secondary | ICD-10-CM | POA: Diagnosis not present

## 2016-02-29 DIAGNOSIS — N939 Abnormal uterine and vaginal bleeding, unspecified: Secondary | ICD-10-CM

## 2016-02-29 DIAGNOSIS — N921 Excessive and frequent menstruation with irregular cycle: Secondary | ICD-10-CM

## 2016-02-29 DIAGNOSIS — Z975 Presence of (intrauterine) contraceptive device: Principal | ICD-10-CM

## 2016-02-29 LAB — CBC
HCT: 43.2 % (ref 35.0–45.0)
HEMOGLOBIN: 14.3 g/dL (ref 11.7–15.5)
MCH: 32 pg (ref 27.0–33.0)
MCHC: 33.1 g/dL (ref 32.0–36.0)
MCV: 96.6 fL (ref 80.0–100.0)
MPV: 8.9 fL (ref 7.5–12.5)
Platelets: 281 10*3/uL (ref 140–400)
RBC: 4.47 MIL/uL (ref 3.80–5.10)
RDW: 12.6 % (ref 11.0–15.0)
WBC: 8 10*3/uL (ref 3.8–10.8)

## 2016-02-29 LAB — TSH: TSH: 1.87 mIU/L

## 2016-02-29 NOTE — Progress Notes (Signed)
Subjective:     Patient ID: Katie Melton, female   DOB: 03/03/1991, 25 y.o.   MRN: [id number]  HPI 25 y.o. G2P1011 presents to the office to have her Nexplanon removed. She had the Nexplanon placed in December 2016 after a second miscarriage that year.  She reports significant breakthrough bleeding since the device was placed. She has tried Nuvaring x several months but continues to have 2-3 periods each month that last 5-7 days.  She does not desire hormonal contraception right now and wants to know if her periods are regular off of birth control.  She has thyroid disease but has never seen endocrinology and is not on any medications right now. She is concerned that this is affecting her bleeding.  She has occasional dizziness and fatigue but denies SOB or CP.  She denies vaginal itching/burning, urinary symptoms, h/a, n/v, or fever/chills.    Review of Systems  Constitutional: Negative for chills, fatigue and fever.  Respiratory: Negative for shortness of breath.   Cardiovascular: Negative for chest pain.  Genitourinary: Positive for pelvic pain and vaginal bleeding. Negative for difficulty urinating, dysuria, flank pain, vaginal discharge and vaginal pain.  Neurological: Positive for dizziness. Negative for headaches.  Psychiatric/Behavioral: Negative.        Objective:   Physical Exam VS reviewed, nursing note reviewed,  Constitutional: well developed, well nourished, no distress HEENT: normocephalic CV: normal rate Pulm/chest wall: normal effort Abdomen: soft Neuro: alert and oriented x 3 Skin: warm, dry Psych: affect normal  Nexplanon Removal Patient identified, informed consent performed, consent signed.   Appropriate time out taken. Nexplanon site identified.  Area prepped in usual sterile fashon. One ml of 1% lidocaine was used to anesthetize the area at the distal end of the implant. A small stab incision was made right beside the implant on the distal portion.  The Nexplanon  rod was grasped using hemostats and removed without difficulty.  There was minimal blood loss. There were no complications.  3 ml of 1% lidocaine was injected around the incision for post-procedure analgesia.  Steri-strips were applied over the small incision.  A pressure bandage was applied to reduce any bruising.  The patient tolerated the procedure well and was given post procedure instructions.  Patient is planning to use condoms for contraception temporarily and decide on pregnancy or contraception in a few months.  LEFTWICH-KIRBY, LISA, CNM 4:34 PM    Assessment:     1. Breakthrough bleeding on Nexplanon  - CBC - TSH  2. Encounter for Nexplanon removal   3. Abnormal uterine bleeding (AUB)     Plan:     CBC and TSH today Discussed alternatives to Nexplanon including IUD, which pt tried before but had cramping/pelvic pain.  May be interested in trying IUD again in the future but wants to see what bleeding pattern does without hormones.  Discussed risk of pregnancy starting today when Nexplanon removed. Pt aware.  Will call office to schedule further contraception. Recommend f/u with endocrinologist for thyroid disorder. TSH pending today.

## 2016-03-01 ENCOUNTER — Telehealth: Payer: Self-pay | Admitting: *Deleted

## 2016-03-01 NOTE — Telephone Encounter (Signed)
Pt left message stating that she was in the office yesterday for Nexplanon removal. She has questions and requests a call back.

## 2016-03-01 NOTE — Telephone Encounter (Signed)
I have spoken with patient in regards to question about bandage being removed. I have advised patient she can removed her bandage and just placed a bandage over it.

## 2016-03-02 ENCOUNTER — Telehealth: Payer: Self-pay

## 2016-03-02 ENCOUNTER — Encounter (HOSPITAL_COMMUNITY): Payer: Self-pay | Admitting: *Deleted

## 2016-03-02 ENCOUNTER — Inpatient Hospital Stay (HOSPITAL_COMMUNITY)
Admission: AD | Admit: 2016-03-02 | Discharge: 2016-03-02 | Disposition: A | Discharge status: Home / Self Care | Payer: BLUE CROSS/BLUE SHIELD | Source: Ambulatory Visit | Attending: Obstetrics & Gynecology | Admitting: Obstetrics & Gynecology

## 2016-03-02 DIAGNOSIS — N939 Abnormal uterine and vaginal bleeding, unspecified: Secondary | ICD-10-CM | POA: Insufficient documentation

## 2016-03-02 DIAGNOSIS — F17200 Nicotine dependence, unspecified, uncomplicated: Secondary | ICD-10-CM | POA: Insufficient documentation

## 2016-03-02 DIAGNOSIS — Z3202 Encounter for pregnancy test, result negative: Secondary | ICD-10-CM | POA: Diagnosis not present

## 2016-03-02 LAB — URINALYSIS, ROUTINE W REFLEX MICROSCOPIC
BILIRUBIN URINE: NEGATIVE
Glucose, UA: NEGATIVE mg/dL
Ketones, ur: NEGATIVE mg/dL
Leukocytes, UA: NEGATIVE
Nitrite: NEGATIVE
PH: 5.5 (ref 5.0–8.0)
Protein, ur: NEGATIVE mg/dL
SPECIFIC GRAVITY, URINE: 1.025 (ref 1.005–1.030)

## 2016-03-02 LAB — WET PREP, GENITAL
Clue Cells Wet Prep HPF POC: NONE SEEN
SPERM: NONE SEEN
TRICH WET PREP: NONE SEEN
YEAST WET PREP: NONE SEEN

## 2016-03-02 LAB — URINE MICROSCOPIC-ADD ON

## 2016-03-02 LAB — CBC
HCT: 39.2 % (ref 36.0–46.0)
Hemoglobin: 13.6 g/dL (ref 12.0–15.0)
MCH: 32.2 pg (ref 26.0–34.0)
MCHC: 34.7 g/dL (ref 30.0–36.0)
MCV: 92.9 fL (ref 78.0–100.0)
Platelets: 239 10*3/uL (ref 150–400)
RBC: 4.22 MIL/uL (ref 3.87–5.11)
RDW: 12.6 % (ref 11.5–15.5)
WBC: 6.1 10*3/uL (ref 4.0–10.5)

## 2016-03-02 LAB — HCG, SERUM, QUALITATIVE: Preg, Serum: NEGATIVE

## 2016-03-02 LAB — POCT PREGNANCY, URINE: Preg Test, Ur: NEGATIVE

## 2016-03-02 MED ORDER — MEDROXYPROGESTERONE ACETATE 10 MG PO TABS: 10.0000 mg | ORAL_TABLET | Freq: Every day | ORAL | 2 refills | Status: AC

## 2016-03-02 MED ORDER — NAPROXEN SODIUM 550 MG PO TABS: 550.0000 mg | ORAL_TABLET | Freq: Two times a day (BID) | ORAL | 0 refills | Status: AC

## 2016-03-02 NOTE — MAU Provider Note (Signed)
Chief Complaint: Vaginal Bleeding   First Provider Initiated Contact with Patient 03/02/16 1430      SUBJECTIVE HPI: Katie Melton is a 25 y.o. [id number] who presents to maternity admissions reporting heavy vaginal bleeding starting yesterday. She reports vaginal bleeding on most days x several weeks and was seen in the office on Tuesday this week to have her Nexplanon removed related to bleeding.  After the removal, she started to bleed heavier, becoming more heavy and soaking 2 tampons/hour and onto her pad today.  She reports the bleeding is associated with some menstrual-like intermittent abdominal cramping, dizziness, and fatigue.  She has tried ibuprofen and Aleve, which are not helping with bleeding or pain.  She last had this amount of bleeding after her miscarriage and is worried about this.  She also desires STD testing.  She desires pregnancy in the near future so does not want any long term contraception. She denies vaginal itching/burning, urinary symptoms, h/a, n/v, or fever/chills.     HPI  Past Medical History:  Diagnosis Date  . Hashimoto's disease   . MRSA (methicillin resistant Staphylococcus aureus)   . PONV (postoperative nausea and vomiting)    Past Surgical History:  Procedure Laterality Date  . DILATION AND EVACUATION N/A 04/21/2015   Procedure: DILATATION AND EVACUATION;  Surgeon: Catalina Antigua, MD;  Location: [facility];  Service: Gynecology;  Laterality: N/A;  . LEG WOUND REPAIR / CLOSURE     has MRSA on thigh, had to be cut out  . WISDOM TOOTH EXTRACTION     Social History   Social History  . Marital status: Single    Spouse name: N/A  . Number of children: N/A  . Years of education: N/A   Occupational History  . Not on file.   Social History Main Topics  . Smoking status: Current Every Day Smoker  . Smokeless tobacco: Never Used  . Alcohol use No  . Drug use: No  . Sexual activity: Yes   Other Topics Concern  . Not on file   Social History  Narrative  . No narrative on file   No current facility-administered medications on file prior to encounter.    Current Outpatient Prescriptions on File Prior to Encounter  Medication Sig Dispense Refill  . ibuprofen (ADVIL,MOTRIN) 600 MG tablet Take 1 tablet (600 mg total) by mouth every 6 (six) hours as needed. 30 tablet 1  . butalbital-acetaminophen-caffeine (FIORICET, ESGIC) 50-325-40 MG tablet Take 1-2 tablets by mouth every 6 (six) hours as needed for headache. (Patient not taking: Reported on 02/29/2016) 10 tablet 0  . etonogestrel-ethinyl estradiol (NUVARING) 0.12-0.015 MG/24HR vaginal ring Insert vaginally and leave in place for 3 consecutive weeks, then remove for 1 week. (Patient not taking: Reported on 03/02/2016) 1 each 12  . oxyCODONE-acetaminophen (PERCOCET/ROXICET) 5-325 MG tablet Take 1 tablet by mouth every 4 (four) hours as needed. (Patient not taking: Reported on 03/02/2016) 20 tablet 0   Allergies  Allergen Reactions  . Flagyl [Metronidazole] Hives  . Latex Itching  . Sulfa Antibiotics Hives    ROS:  Review of Systems  Constitutional: Negative for chills, fatigue and fever.  Respiratory: Negative for shortness of breath.   Cardiovascular: Negative for chest pain.  Genitourinary: Positive for pelvic pain and vaginal bleeding. Negative for difficulty urinating, dysuria, flank pain, vaginal discharge and vaginal pain.  Neurological: Negative for dizziness and headaches.  Psychiatric/Behavioral: Negative.      I have reviewed patient's Past Medical Hx, Surgical Hx, Family Hx,  Social Hx, medications and allergies.   Physical Exam  Patient Vitals for the past 24 hrs:  BP Temp Temp src Pulse Resp Height Weight  03/02/16 1151 125/77 97.9 F (36.6 C) Oral 77 18 5\' 1"  (1.549 m) 96 lb (43.5 kg)   Constitutional: Well-developed, well-nourished female in no acute distress.  Cardiovascular: normal rate Respiratory: normal effort GI: Abd soft, non-tender. Pos BS x  4 MS: Extremities nontender, no edema, normal ROM Neurologic: Alert and oriented x 4.  GU: Neg CVAT.  PELVIC EXAM: Cervix pink, visually closed, without lesion, moderate amount dark red bleeding without clots, vaginal walls and external genitalia normal Bimanual exam: Cervix 0/long/high, firm, anterior, neg CMT, uterus mildly tender, nonenlarged, adnexa with mild tenderness bilaterally, no enlargement or mass bilaterally   LAB RESULTS Results for orders placed or performed during the hospital encounter of 03/02/16 (from the past 24 hour(s))  Urinalysis, Routine w reflex microscopic ([facility])     Status: Abnormal   Collection Time: 03/02/16 11:53 AM  Result Value Ref Range   Color, Urine YELLOW YELLOW   APPearance CLEAR CLEAR   Specific Gravity, Urine 1.025 1.005 - 1.030   pH 5.5 5.0 - 8.0   Glucose, UA NEGATIVE NEGATIVE mg/dL   Hgb urine dipstick LARGE (A) NEGATIVE   Bilirubin Urine NEGATIVE NEGATIVE   Ketones, ur NEGATIVE NEGATIVE mg/dL   Protein, ur NEGATIVE NEGATIVE mg/dL   Nitrite NEGATIVE NEGATIVE   Leukocytes, UA NEGATIVE NEGATIVE  Urine microscopic-add on     Status: Abnormal   Collection Time: 03/02/16 11:53 AM  Result Value Ref Range   Squamous Epithelial / LPF 0-5 (A) NONE SEEN   WBC, UA 0-5 0 - 5 WBC/hpf   RBC / HPF 6-30 0 - 5 RBC/hpf   Bacteria, UA RARE (A) NONE SEEN  Pregnancy, urine POC     Status: None   Collection Time: 03/02/16 12:31 PM  Result Value Ref Range   Preg Test, Ur NEGATIVE NEGATIVE  CBC     Status: None   Collection Time: 03/02/16  1:11 PM  Result Value Ref Range   WBC 6.1 4.0 - 10.5 K/uL   RBC 4.22 3.87 - 5.11 MIL/uL   Hemoglobin 13.6 12.0 - 15.0 g/dL   HCT 16.139.2 09.636.0 - 04.546.0 %   MCV 92.9 78.0 - 100.0 fL   MCH 32.2 26.0 - 34.0 pg   MCHC 34.7 30.0 - 36.0 g/dL   RDW 40.912.6 81.111.5 - 91.415.5 %   Platelets 239 150 - 400 K/uL  hCG, serum, qualitative     Status: None   Collection Time: 03/02/16  1:11 PM  Result Value Ref Range   Preg, Serum  NEGATIVE NEGATIVE    --/--/A POS, A POS (12/06 1458)  IMAGING No results found.  MAU Management/MDM: Ordered labs and reviewed results.  GCC pending.  Likely bleeding pattern still affected by recent Nexplanon.  Hgb stable from labwork done in office on Tuesday. TSH wnl at office visit.  Will treat with Provera x 10 days and f/u in office if irregular/heavy bleeding returns.  Pt stable at time of discharge.  ASSESSMENT 1. Abnormal uterine bleeding (AUB)     PLAN Discharge home   Medication List    STOP taking these medications   butalbital-acetaminophen-caffeine 50-325-40 MG tablet Commonly known as:  FIORICET, ESGIC   etonogestrel-ethinyl estradiol 0.12-0.015 MG/24HR vaginal ring Commonly known as:  NUVARING   ibuprofen 600 MG tablet Commonly known as:  ADVIL,MOTRIN   oxyCODONE-acetaminophen  5-325 MG tablet Commonly known as:  PERCOCET/ROXICET     TAKE these medications   naproxen sodium 550 MG tablet Commonly known as:  ANAPROX DS Take 1 tablet (550 mg total) by mouth 2 (two) times daily with a meal.      Follow-up Information    [facility] .   Specialty:  Obstetrics and Gynecology Why:  Or return to MAU as needed for emergencies Contact information: [address] [place] [address] [phone]          Sharen Counter Certified Nurse-Midwife 03/02/2016  2:32 PM

## 2016-03-02 NOTE — Discharge Instructions (Signed)

## 2016-03-02 NOTE — Telephone Encounter (Signed)
Received call from patient stating she is bleeding heavy and has used 3 tampons in the last hour. She is concerns because she had nexplanon removed two days ago. I explained to  she may experience some bleeding until her body gets back from being on the nexplanon. Patient stated she feels weak and tried at this and is very anxious about her bleeding.   I have advised patient to go to MAU to be check out since she is concerned something else may be going on with her body.  Patient verbalizes understanding at this time

## 2016-03-02 NOTE — MAU Note (Signed)
Pt had explanon removed on Monday. Pt reports havivg heavy vag bleeding even before she had it removed. changing 2-3 tampons an hour. Feels weak  And tired.

## 2016-03-03 LAB — GC/CHLAMYDIA PROBE AMP (~~LOC~~) NOT AT ARMC
CHLAMYDIA, DNA PROBE: NEGATIVE
NEISSERIA GONORRHEA: NEGATIVE

## 2016-03-08 ENCOUNTER — Telehealth: Payer: Self-pay | Admitting: *Deleted

## 2016-03-08 NOTE — Telephone Encounter (Signed)
Pt left message stating that she was prescribed Provera on 10/19 by "Misty StanleyLisa" and is having problems with the medication. She requests a call back.

## 2016-03-09 ENCOUNTER — Ambulatory Visit: Payer: BLUE CROSS/BLUE SHIELD | Admitting: Obstetrics and Gynecology

## 2016-03-09 NOTE — Telephone Encounter (Signed)
Pt called and stated that she was put on a prescription for bleeding. The bleeding has improved but is not completely gone.

## 2016-03-22 NOTE — Telephone Encounter (Signed)
Patient called and stated she has not had a period yet. Patient is concerned something may be wrong.

## 2016-03-24 NOTE — Telephone Encounter (Signed)
Called patient and left message that I am returning her call please call us back Monday to discuss her concerns.

## 2017-08-27 IMAGING — US US OB COMP LESS 14 WK
1 series · 15 of 28 positions shown · non-contrast
Comparison: None.

CLINICAL DATA: No fetal heart tones

EXAM:
OBSTETRIC <14 WK ULTRASOUND
TECHNIQUE: Transabdominal ultrasound was performed for evaluation of the
gestation as well as the maternal uterus and adnexal regions.

[Series 1: us ob comp less 14 wk · 48 acquisitions, 15 frames shown]
[im 1/48]
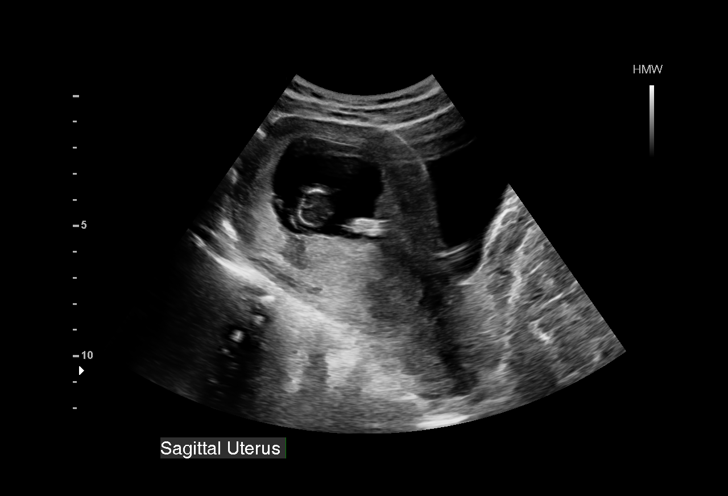
[im 4/48]
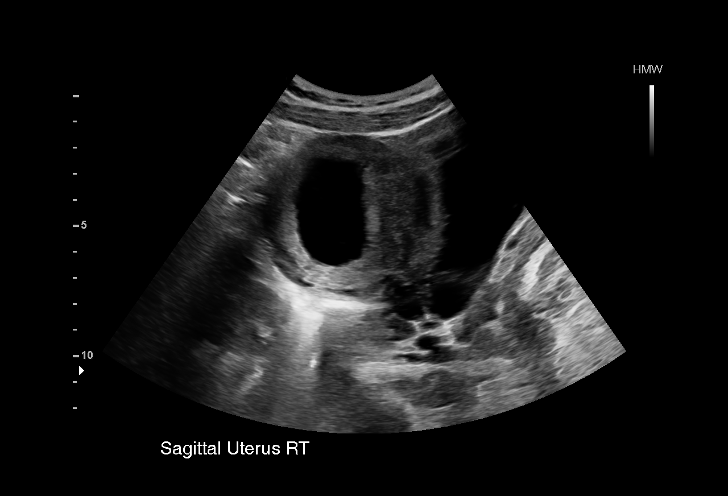
[im 7/48]
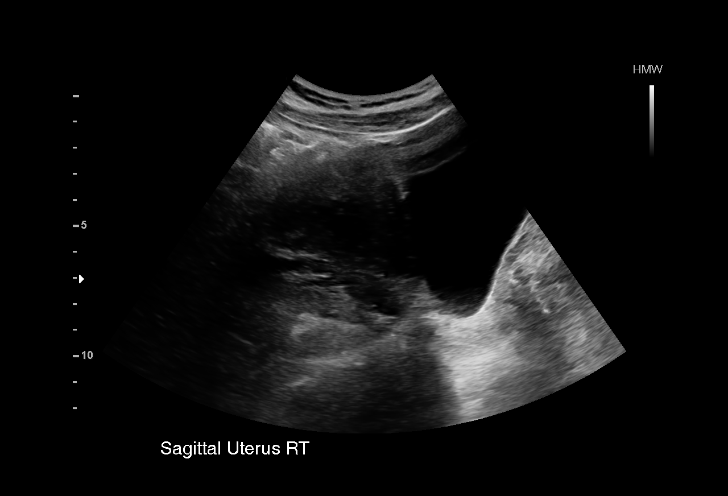
[im 11/48]
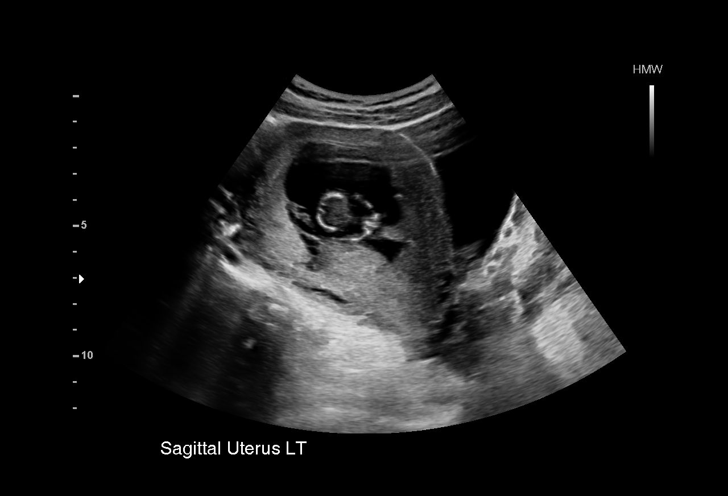
[im 14/48]
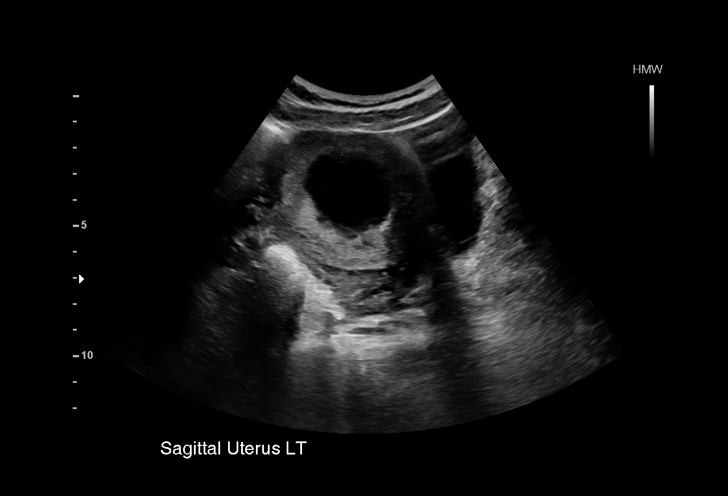
[im 18/48]
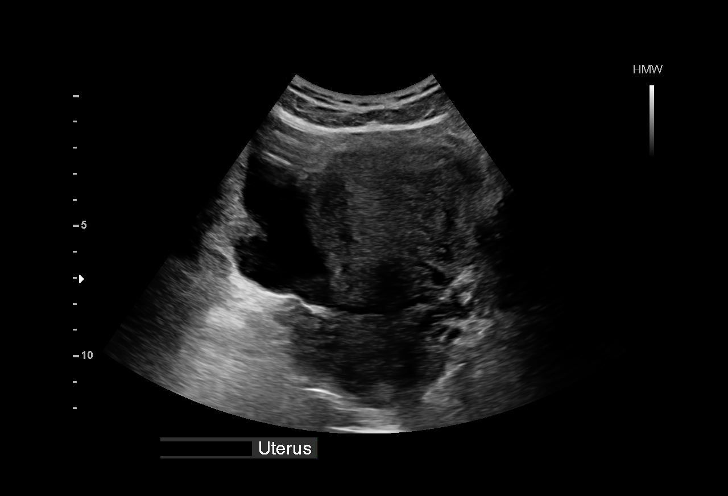
[im 21/48]
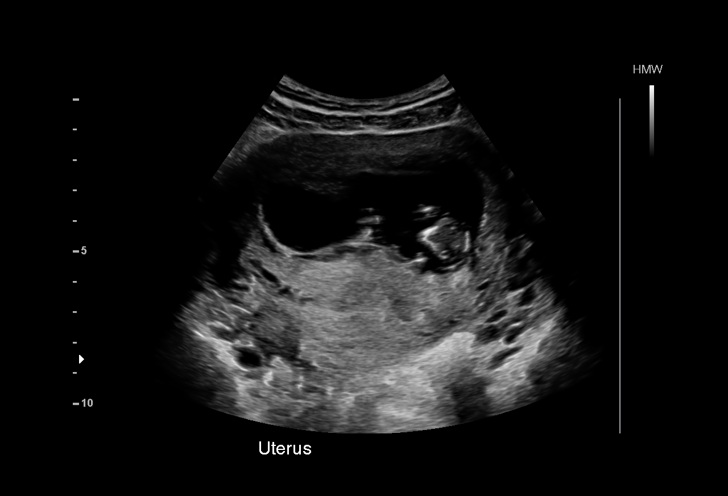
[im 25/48]
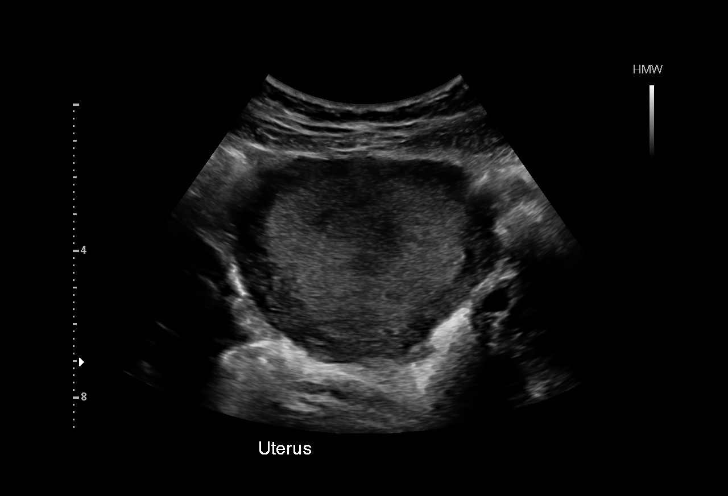
[im 27/48]
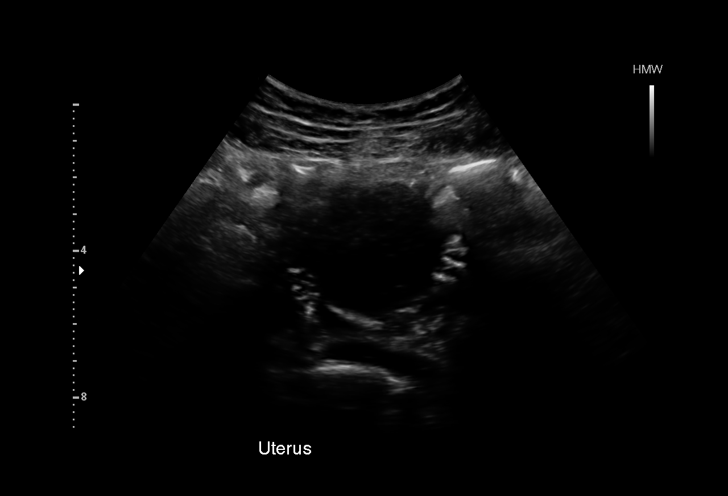
[im 30/48]
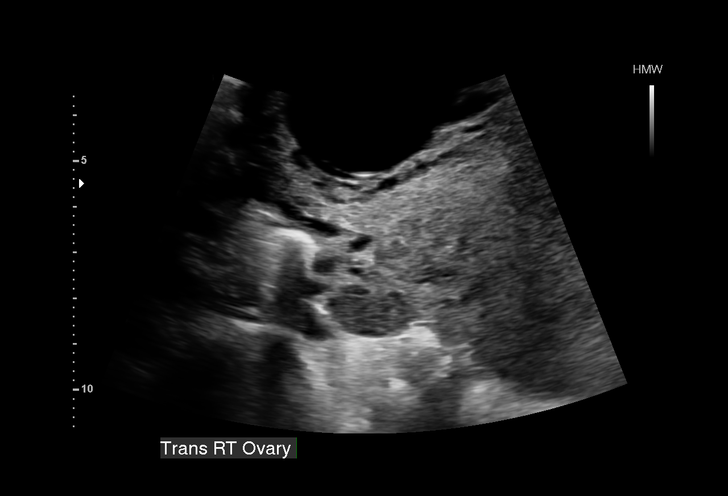
[im 34/48]
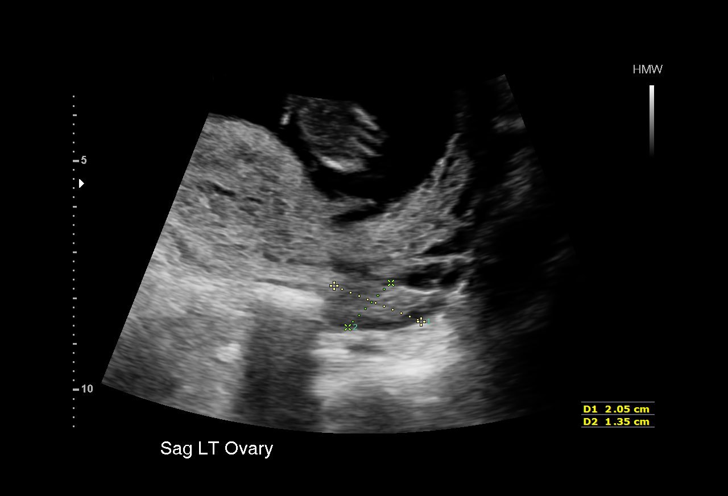
[im 37/48]
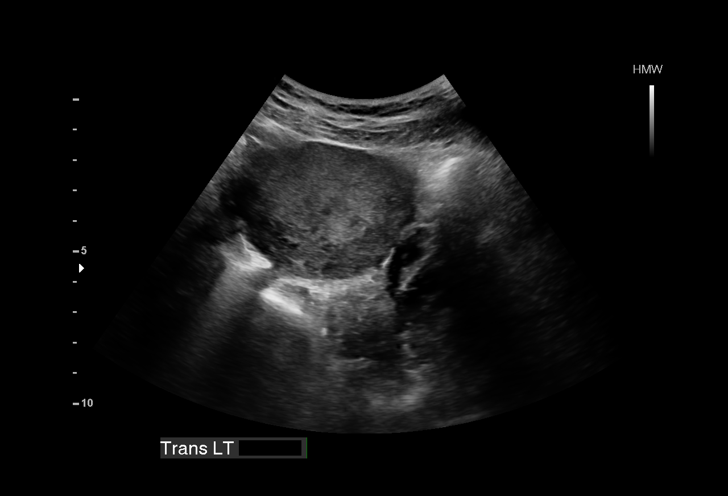
[im 41/48]
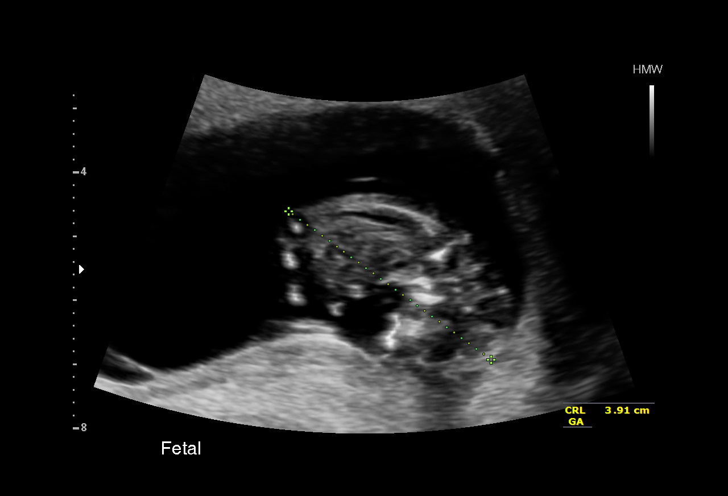
[im 44/48]
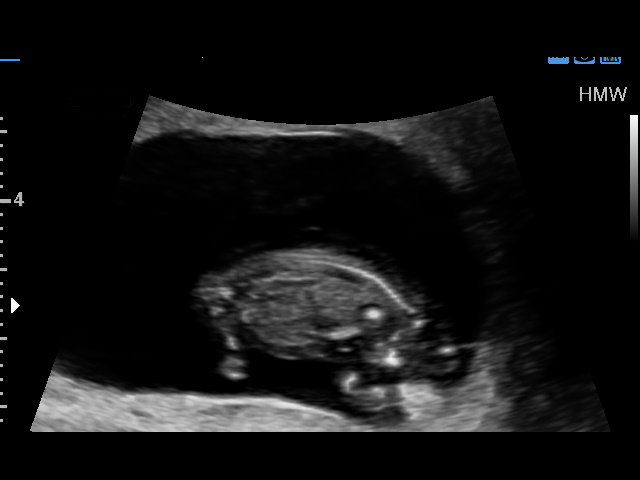
[im 48/48]
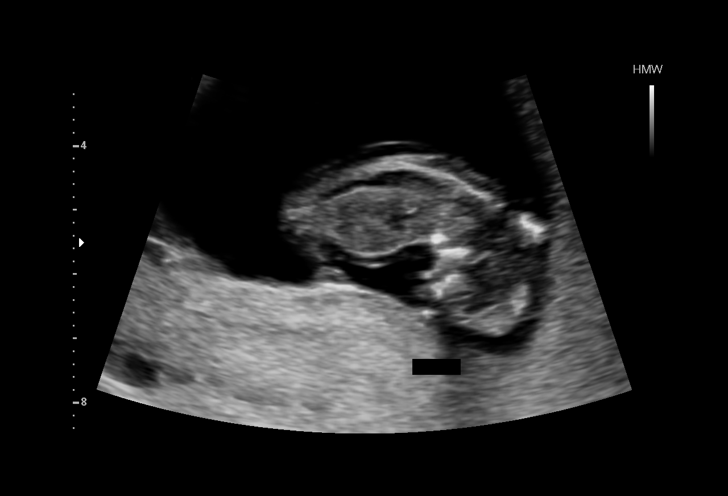

[15 of 28 positions shown; findings below may reference images not displayed]

FINDINGS: Intrauterine gestational sac: Visualized/normal in shape.

Yolk sac:  Not visualized

Embryo:  Visualized

Cardiac Activity: Not visualized

Heart Rate:  bpm

MSD:   mm    w     d

CRL:   40.1  mm   10 w 6 d                  US EDC: 11/10/2015

Maternal uterus/adnexae: No subchorionic hemorrhage. No adnexal
masses or free fluid.
IMPRESSION: No detectable fetal heart tones on today's study. Estimated
gestational age 10 weeks 6 days. Findings meet definitive criteria
for failed pregnancy. This follows SRU consensus guidelines:
Diagnostic Criteria for Nonviable Pregnancy Early in the First
Trimester. N Engl J Med 4029;[DATE].
